# Patient Record
Sex: Male | Born: 1995 | Race: Black or African American | Hispanic: No | Marital: Single | Smoking: Never smoker
Health system: Southern US, Community
[De-identification: ages and names within clinical notes are randomized; demographics above are authoritative.]

## PROBLEM LIST (undated history)

## (undated) HISTORY — PX: OTHER SURGICAL HISTORY: SHX169

---

## 2019-03-23 ENCOUNTER — Emergency Department (HOSPITAL_COMMUNITY)
Admission: EM | Admit: 2019-03-23 | Discharge: 2019-03-23 | Disposition: A | Discharge status: Home / Self Care | Payer: Federal, State, Local not specified - PPO | Attending: Emergency Medicine | Admitting: Emergency Medicine

## 2019-03-23 ENCOUNTER — Emergency Department (HOSPITAL_COMMUNITY): Payer: Federal, State, Local not specified - PPO

## 2019-03-23 ENCOUNTER — Other Ambulatory Visit: Payer: Self-pay

## 2019-03-23 DIAGNOSIS — Y999 Unspecified external cause status: Secondary | ICD-10-CM | POA: Insufficient documentation

## 2019-03-23 DIAGNOSIS — Y939 Activity, unspecified: Secondary | ICD-10-CM | POA: Diagnosis not present

## 2019-03-23 DIAGNOSIS — S21241A Puncture wound with foreign body of right back wall of thorax without penetration into thoracic cavity, initial encounter: Secondary | ICD-10-CM | POA: Diagnosis not present

## 2019-03-23 DIAGNOSIS — Y929 Unspecified place or not applicable: Secondary | ICD-10-CM | POA: Insufficient documentation

## 2019-03-23 DIAGNOSIS — S31144A Puncture wound of abdominal wall with foreign body, left lower quadrant without penetration into peritoneal cavity, initial encounter: Secondary | ICD-10-CM | POA: Insufficient documentation

## 2019-03-23 DIAGNOSIS — W3400XA Accidental discharge from unspecified firearms or gun, initial encounter: Secondary | ICD-10-CM

## 2019-03-23 DIAGNOSIS — Z23 Encounter for immunization: Secondary | ICD-10-CM | POA: Diagnosis not present

## 2019-03-23 LAB — LACTIC ACID, PLASMA
Lactic Acid, Venous: 2.9 mmol/L (ref 0.5–1.9)
Lactic Acid, Venous: 1.3 mmol/L (ref 0.5–1.9)

## 2019-03-23 LAB — BASIC METABOLIC PANEL
Anion gap: 13 (ref 5–15)
BUN: 10 mg/dL (ref 6–20)
CO2: 23 mmol/L (ref 22–32)
Calcium: 9.7 mg/dL (ref 8.9–10.3)
Chloride: 105 mmol/L (ref 98–111)
Creatinine, Ser: 1.13 mg/dL (ref 0.61–1.24)
GFR calc Af Amer: >60 mL/min (ref >60)
GFR calc non Af Amer: >60 mL/min (ref >60)
Glucose, Bld: 113 mg/dL — ABNORMAL HIGH (ref 70–99)
Potassium: 3.9 mmol/L (ref 3.5–5.1)
Sodium: 141 mmol/L (ref 135–145)

## 2019-03-23 LAB — I-STAT CHEM 8, ED
BUN: 11 mg/dL (ref 6–20)
Calcium, Ion: 1.19 mmol/L (ref 1.15–1.40)
Chloride: 104 mmol/L (ref 98–111)
Creatinine, Ser: 1 mg/dL (ref 0.61–1.24)
Glucose, Bld: 110 mg/dL — ABNORMAL HIGH (ref 70–99)
HCT: 49 % (ref 39.0–52.0)
Hemoglobin: 16.7 g/dL (ref 13.0–17.0)
Potassium: 3.9 mmol/L (ref 3.5–5.1)
Sodium: 142 mmol/L (ref 135–145)
TCO2: 27 mmol/L (ref 22–32)

## 2019-03-23 LAB — CBC
HCT: 47.3 % (ref 39.0–52.0)
Hemoglobin: 15.3 g/dL (ref 13.0–17.0)
MCH: 28.8 pg (ref 26.0–34.0)
MCHC: 32.3 g/dL (ref 30.0–36.0)
MCV: 88.9 fL (ref 80.0–100.0)
Platelets: 257 10*3/uL (ref 150–400)
RBC: 5.32 MIL/uL (ref 4.22–5.81)
RDW: 13.7 % (ref 11.5–15.5)
WBC: 7.2 10*3/uL (ref 4.0–10.5)
nRBC: 0 % (ref 0.0–0.2)

## 2019-03-23 LAB — PROTIME-INR
INR: 1 (ref 0.8–1.2)
Prothrombin Time: 12.9 seconds (ref 11.4–15.2)

## 2019-03-23 LAB — SAMPLE TO BLOOD BANK

## 2019-03-23 LAB — CDS SEROLOGY

## 2019-03-23 LAB — ETHANOL: Alcohol, Ethyl (B): <10 mg/dL (ref <10)

## 2019-03-23 MED ORDER — IOHEXOL 300 MG/ML  SOLN
100.0000 mL | Freq: Once | INTRAMUSCULAR | Status: AC | PRN
  Administered 2019-03-23: 02:00:00 100 mL via INTRAVENOUS

## 2019-03-23 MED ORDER — SODIUM CHLORIDE 0.9 % IV BOLUS
1000.0000 mL | Freq: Once | INTRAVENOUS | Status: AC
  Administered 2019-03-23: 03:00:00 1000 mL via INTRAVENOUS

## 2019-03-23 MED ORDER — TETANUS-DIPHTH-ACELL PERTUSSIS 5-2.5-18.5 LF-MCG/0.5 IM SUSP
0.5000 mL | Freq: Once | INTRAMUSCULAR | Status: AC
  Administered 2019-03-23: 0.5 mL via INTRAMUSCULAR
  Filled 2019-03-23: qty 0.5

## 2019-03-23 NOTE — ED Notes (Signed)
Pt discharged from ED; instructions provided; Pt encouraged to return to ED if symptoms worsen and to f/u with PCP; Pt verbalized understanding of all instructions 
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
## 2019-05-31 ENCOUNTER — Ambulatory Visit (HOSPITAL_COMMUNITY)
Admission: EM | Admit: 2019-05-31 | Discharge: 2019-05-31 | Disposition: A | Discharge status: Home / Self Care | Payer: Federal, State, Local not specified - PPO | Attending: Family Medicine | Admitting: Family Medicine

## 2019-05-31 ENCOUNTER — Other Ambulatory Visit: Payer: Self-pay

## 2019-05-31 ENCOUNTER — Encounter (HOSPITAL_COMMUNITY): Payer: Self-pay | Admitting: Family Medicine

## 2019-05-31 DIAGNOSIS — Z5189 Encounter for other specified aftercare: Secondary | ICD-10-CM

## 2019-05-31 DIAGNOSIS — W3400XA Accidental discharge from unspecified firearms or gun, initial encounter: Secondary | ICD-10-CM | POA: Diagnosis not present
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
